# Patient Record
Sex: Male | Born: 1995 | Race: Black or African American | Hispanic: No | Marital: Single | Smoking: Current every day smoker
Health system: Southern US, Community
[De-identification: ages and names within clinical notes are randomized; demographics above are authoritative.]

## PROBLEM LIST (undated history)

## (undated) HISTORY — PX: HERNIA REPAIR: SHX51

---

## 2016-04-21 ENCOUNTER — Emergency Department (HOSPITAL_COMMUNITY): Payer: Medicaid Other

## 2016-04-21 ENCOUNTER — Encounter (HOSPITAL_COMMUNITY): Payer: Self-pay | Admitting: Emergency Medicine

## 2016-04-21 ENCOUNTER — Emergency Department (HOSPITAL_COMMUNITY)
Admission: EM | Admit: 2016-04-21 | Discharge: 2016-04-21 | Disposition: A | Discharge status: Home / Self Care | Payer: Medicaid Other | Attending: Emergency Medicine | Admitting: Emergency Medicine

## 2016-04-21 DIAGNOSIS — J029 Acute pharyngitis, unspecified: Secondary | ICD-10-CM | POA: Diagnosis present

## 2016-04-21 DIAGNOSIS — J069 Acute upper respiratory infection, unspecified: Secondary | ICD-10-CM

## 2016-04-21 DIAGNOSIS — F17210 Nicotine dependence, cigarettes, uncomplicated: Secondary | ICD-10-CM | POA: Insufficient documentation

## 2016-04-21 LAB — RAPID STREP SCREEN (MED CTR MEBANE ONLY): STREPTOCOCCUS, GROUP A SCREEN (DIRECT): NEGATIVE

## 2016-04-21 MED ORDER — IBUPROFEN 200 MG PO TABS
400.0000 mg | ORAL_TABLET | Freq: Once | ORAL | Status: AC
  Administered 2016-04-21: 400 mg via ORAL
  Filled 2016-04-21: qty 2

## 2016-04-21 NOTE — ED Provider Notes (Signed)
CSN: [id number]     Arrival date & time 04/21/16  1615 History  By signing my name below, I, Marisue HumbleMichelle Chaffee, attest that this documentation has been prepared under the direction and in the presence of non-physician practitioner, Wynetta EmeryNicole Roshaun Pound, PA-C. Electronically Signed: Marisue HumbleMichelle Chaffee, Scribe. 04/21/2016. 5:20 PM.   Chief Complaint  Patient presents with  . Sore Throat    sore throat x 1 day    The history is provided by the patient. No language interpreter was used.   HPI Comments:  Willie Morris is a 20 y.o. male who presents to the Emergency Department complaining of sore throat onset last night. Pt reports associated cough productive of yellow mucous, mild low back pain onset this morning, subjective fever, chest pain this morning and a "red spot on my chest" this morning. He has used cough drops with mild relief; no other treatments attempted PTA. Denies ear pain or rhinorrhea.   History reviewed. No pertinent past medical history. Past Surgical History  Procedure Laterality Date  . Hernia repair     Family History  Problem Relation Age of Onset  . Diabetes Mother    Social History  Substance Use Topics  . Smoking status: Current Every Day Smoker    Types: Cigarettes  . Smokeless tobacco: None  . Alcohol Use: No    Review of Systems  10 systems reviewed and all are negative for acute change except as noted in the HPI.   Allergies  Penicillins  Home Medications   Prior to Admission medications   Not on File   BP 127/51 mmHg  Pulse 106  Temp(Src) 99.6 F (37.6 C) (Oral)  Resp 18  Wt 150 lb (68.04 kg)  SpO2 95% Physical Exam  Constitutional: He appears well-developed and well-nourished.  HENT:  Head: Normocephalic.  Right Ear: External ear normal.  Left Ear: External ear normal.  Mouth/Throat: Oropharynx is clear and moist. No oropharyngeal exudate.  No drooling or stridor. Posterior pharynx mildly erythematous no significant tonsillar  hypertrophy. No exudate. Soft palate rises symmetrically. No TTP or induration under tongue.   No tenderness to palpation of frontal or bilateral maxillary sinuses.  Mild mucosal edema in the nares with scant rhinorrhea.  Bilateral tympanic membranes with normal architecture and good light reflex.    Eyes: Conjunctivae and EOM are normal. Pupils are equal, round, and reactive to light.  Neck: Normal range of motion. Neck supple.  Cardiovascular: Normal rate and regular rhythm.   Pulmonary/Chest: Effort normal and breath sounds normal. No stridor. No respiratory distress. He has no wheezes. He has no rales. He exhibits no tenderness.  Abdominal: Soft. There is no tenderness. There is no rebound and no guarding.  Nursing note and vitals reviewed.   ED Course  Procedures  DIAGNOSTIC STUDIES:  Oxygen Saturation is 95% on RA, adequate by my interpretation.    COORDINATION OF CARE:  5:18 PM Will order chest x-ray. Recommended pt take Ibuprofen. Recommended rest and fluids. Discussed treatment plan with pt at bedside and pt agreed to plan.  Labs Review Labs Reviewed  RAPID STREP SCREEN (NOT AT [facility])    Imaging Review No results found. I have personally reviewed and evaluated these images and lab results as part of my medical decision-making.   EKG Interpretation None      MDM   Final diagnoses:  Viral URI    Filed Vitals:   04/21/16 1659 04/21/16 1752  BP: 127/51 111/63  Pulse: 106 114  Temp: 99.6 F (  37.6 C) 99.4 F (37.4 C)  TempSrc: Oral Oral  Resp: 18 16  Weight: 68.04 kg   SpO2: 95% 96%    Medications  ibuprofen (ADVIL,MOTRIN) tablet 400 mg (400 mg Oral Given 04/21/16 1724)    Robt Brossart is 20 y.o. male presenting with cough and sore throat. NAD, Non-toxic appearing, AFVSS, LSCTA. Likely viral illness, advised patient to push fluids, over-the-counter medications for symptom relief.   Evaluation does not show pathology that would require  ongoing emergent intervention or inpatient treatment. Pt is hemodynamically stable and mentating appropriately. Discussed findings and plan with patient/guardian, who agrees with care plan. All questions answered. Return precautions discussed and outpatient follow up given.   I personally performed the services described in this documentation, which was scribed in my presence.  The recorded information has been reviewed and is accurate.    Wynetta Emery, PA-C 04/21/16 1845  Loren Racer, MD 04/21/16 2215

## 2016-04-21 NOTE — ED Notes (Signed)
Pt reports productive cough and sore throat x 24 hours. Did not attempt to medicate with OTC meds

## 2016-04-21 NOTE — Discharge Instructions (Signed)
Do not hesitate to return to the emergency room for any new, worsening or concerning symptoms.  Please obtain primary care using resource guide below. Let them know that you were seen in the emergency room and that they will need to obtain records for further outpatient management.     Upper Respiratory Infection, Adult Most upper respiratory infections (URIs) are a viral infection of the air passages leading to the lungs. A URI affects the nose, throat, and upper air passages. The most common type of URI is nasopharyngitis and is typically referred to as "the common cold." URIs run their course and usually go away on their own. Most of the time, a URI does not require medical attention, but sometimes a bacterial infection in the upper airways can follow a viral infection. This is called a secondary infection. Sinus and middle ear infections are common types of secondary upper respiratory infections. Bacterial pneumonia can also complicate a URI. A URI can worsen asthma and chronic obstructive pulmonary disease (COPD). Sometimes, these complications can require emergency medical care and may be life threatening.  CAUSES Almost all URIs are caused by viruses. A virus is a type of germ and can spread from one person to another.  RISKS FACTORS You may be at risk for a URI if:   You smoke.   You have chronic heart or lung disease.  You have a weakened defense (immune) system.   You are very young or very old.   You have nasal allergies or asthma.  You work in crowded or poorly ventilated areas.  You work in health care facilities or schools. SIGNS AND SYMPTOMS  Symptoms typically develop 2-3 days after you come in contact with a cold virus. Most viral URIs last 7-10 days. However, viral URIs from the influenza virus (flu virus) can last 14-18 days and are typically more severe. Symptoms may include:   Runny or stuffy (congested) nose.   Sneezing.   Cough.   Sore throat.    Headache.   Fatigue.   Fever.   Loss of appetite.   Pain in your forehead, behind your eyes, and over your cheekbones (sinus pain).  Muscle aches.  DIAGNOSIS  Your health care provider may diagnose a URI by:  Physical exam.  Tests to check that your symptoms are not due to another condition such as:  Strep throat.  Sinusitis.  Pneumonia.  Asthma. TREATMENT  A URI goes away on its own with time. It cannot be cured with medicines, but medicines may be prescribed or recommended to relieve symptoms. Medicines may help:  Reduce your fever.  Reduce your cough.  Relieve nasal congestion. HOME CARE INSTRUCTIONS   Take medicines only as directed by your health care provider.   Gargle warm saltwater or take cough drops to comfort your throat as directed by your health care provider.  Use a warm mist humidifier or inhale steam from a shower to increase air moisture. This may make it easier to breathe.  Drink enough fluid to keep your urine clear or pale yellow.   Eat soups and other clear broths and maintain good nutrition.   Rest as needed.   Return to work when your temperature has returned to normal or as your health care provider advises. You may need to stay home longer to avoid infecting others. You can also use a face mask and careful hand washing to prevent spread of the virus.  Increase the usage of your inhaler if you have asthma.  Do not use any tobacco products, including cigarettes, chewing tobacco, or electronic cigarettes. If you need help quitting, ask your health care provider. PREVENTION  The best way to protect yourself from getting a cold is to practice good hygiene.   Avoid oral or hand contact with people with cold symptoms.   Wash your hands often if contact occurs.  There is no clear evidence that vitamin C, vitamin E, echinacea, or exercise reduces the chance of developing a cold. However, it is always recommended to get plenty  of rest, exercise, and practice good nutrition.  SEEK MEDICAL CARE IF:   You are getting worse rather than better.   Your symptoms are not controlled by medicine.   You have chills.  You have worsening shortness of breath.  You have brown or red mucus.  You have yellow or brown nasal discharge.  You have pain in your face, especially when you bend forward.  You have a fever.  You have swollen neck glands.  You have pain while swallowing.  You have white areas in the back of your throat. SEEK IMMEDIATE MEDICAL CARE IF:   You have severe or persistent:  Headache.  Ear pain.  Sinus pain.  Chest pain.  You have chronic lung disease and any of the following:  Wheezing.  Prolonged cough.  Coughing up blood.  A change in your usual mucus.  You have a stiff neck.  You have changes in your:  Vision.  Hearing.  Thinking.  Mood. MAKE SURE YOU:   Understand these instructions.  Will watch your condition.  Will get help right away if you are not doing well or get worse.   This information is not intended to replace advice given to you by your health care provider. Make sure you discuss any questions you have with your health care provider.   Document Released: 05/12/2001 Document Revised: 04/02/2015 Document Reviewed: 02/21/2014 Elsevier Interactive Patient Education 2016 [employer]. [employer] Assistance The United Ways 211 is a great source of information about community services available.  Access by dialing 2-1-1 from anywhere in [place], or by website -  PooledIncome.pl.   Other Local Resources (Updated 12/2015)  Financial Assistance   Services    Phone Number and Address  [facility]  Low-cost medical care - 1st and 3rd Saturday of every month  Must not qualify for public or private insurance and must have limited income [phone] [address] [place], [place]    [employer] [facility] Social Services  Child care  Emergency assistance for housing and [employer]  Medicaid [phone] [address] [place], [place] [address]   [facility] Department  Low-cost medical care for children, communicable diseases, sexually-transmitted diseases, immunizations, maternity care, womens health and family planning [phone] [address] [place], [place] [address]  [facility]   Medication assistance for [facility] residents  Must meet income requirements [phone] [address] [place], [place].    [facility] Social Services  Child care  Emergency assistance for housing and [employer]  Medicaid [phone] [address] [place], [place] [address]  [facility]   Low-cost medical care,   Monday through Friday, 9 am to 6 pm.   Accepts Medicare/Medicaid, and self-pay [phone] [address] [place], [place] [address]  [facility]  Low-cost medical care - Monday through Friday, 8:30 am -  5:30 pm  Accepts Medicaid and self-pay [phone] [address] [place], [place] [address]   [facility]  Primary medical care, including for those with sickle cell disease  Accepts Medicare, Medicaid, insurance and self-pay [phone] [address] [place], [place]  [facility]   Primary medical care  Accepts Medicare, [place], insurance and self-pay [phone] [address], [address] [place], [place] [address]   [facility] Social Services  Child care  Emergency assistance for housing and [employer]ark[employer]ilities  Food stamps  Medicaid [id number] [address] [place], [place] [address]  [facility] of Health and [employer] Services  Child care  Emergency assistance for housing and  [employer]ilities  Food stamps  Medicaid [phone] [address] [place], [place] [address]   [facility]  Medication assistance for [facility] [place] [place] residents with no insurance only  Must have a primary care doctor [phone] [address], [address] [place], [place]  [facility]   Primary medical care  [place]s Medicare, [place], insurance  [phone] [address] [place], [place]  [facility]   Medication assistance [phone]  [facility]   Primary medical care  Accepts Medicare, [place], insurance and self-pay [phone] [address] [place], [place] [address]  [facility]   Primary medical care  Accepts Medicare, [place], insurance and self-pay [phone] [address] [place], [place] [address]  [facility]  For [place] residents between the ages of 5118 and 1364 who do not have any form of health insurance, Medicare, [place], or [place] benefits.  Services are provided free of charge to uninsured patients who fall within federal poverty guidelines.    Hours: Tuesdays and Thursdays, 4:15 - 8 pm [phone] [address], [address] [place], [place] [address]  [facility]     Primary medical care  Dental care  Nutritional counseling  Pharmacy  Accepts Medicaid, Medicare, most insurance.  Fees are adjusted based on ability to pay.   [phone] [facility] [address] [place], [place]  [phone] [facility] [address] [place], [place]  [phone] [facility] [place], [place]  [phone] [facility], [address] [place], [place]  [phone] [facility] [address] [place], [place]  [facility]  Womens health and family planning [phone] [place], [place]  [facility] of Social Services  Child care  Emergency assistance for housing and Kimberly[employer]Clarkutilities  Food stamps  Medicaid [phone] [address], [place], [place] [address]   [facility]    Ages 1018 and older  Hours: Mondays and Thursdays, 7:00 am - 9:00 am Patients are seen on a first come, first served basis. [phone], [phone]. [phone] [place], [place]  [facility] of Social Services  Child care  Emergency assistance for housing and Kimberly-Clarkutilities  Food stamps  Medicaid [phone] [address] [place], [place] [address]  [facility] [employer] [facility]  Medication assistance  Rental assistance  Food pantry  Medication assistance  Housing assistance  Emergency food distribution  Utility assistance [phone] [address] [place], [place]  [phone]  [address] [place], [place] [address] Hours: Tuesdays and Thursdays from 9am - 12 noon by appointment only  [phone] [address] [place], [place] [address]  [facility] -  Lanae Boast   Accepts private insurance, Medicare, and Medicaid.  Payment is based on a sliding scale for those without insurance.  Hours: Mondays, Tuesdays and Thursdays, 8:30 am - 5:30 pm.   [phone] [address] [place], [place]  [facility] - Family Medicine at [facility], [place], and [place].  Payment is based on a sliding scale for those without insurance. [phone] [address] [place], [place]  [facility] - Pediatrics at E. Scientist, research (physical sciences), [employer], and [place].  Payment is based on a sliding scale for those without insurance [phone] [address], [employer], [place]  [facility] - Pediatrics at [employer], [place], and [place].  Payment is based on  a sliding scale for those without insurance. [phone] [address] [place], [place]  [facility] - Pediatrics at [facility], [place], and [place].  Payment is based on a sliding scale for those without insurance. [phone], ext. [phone] [place], [place].    [facility]  Maternity care.  Accepts Medicaid and self-pay. [phone] [address] [place], [place]

## 2016-04-24 LAB — CULTURE, GROUP A STREP (THRC)

## 2016-10-24 IMAGING — CR DG CHEST 2V
2 series · 2 of 2 positions shown · non-contrast
Comparison: None

CLINICAL DATA: Productive cough beginning last night with some
shortness of breath and LEFT side chest pain, history smoking

EXAM:
CHEST  2 VIEW

[w chest pa]
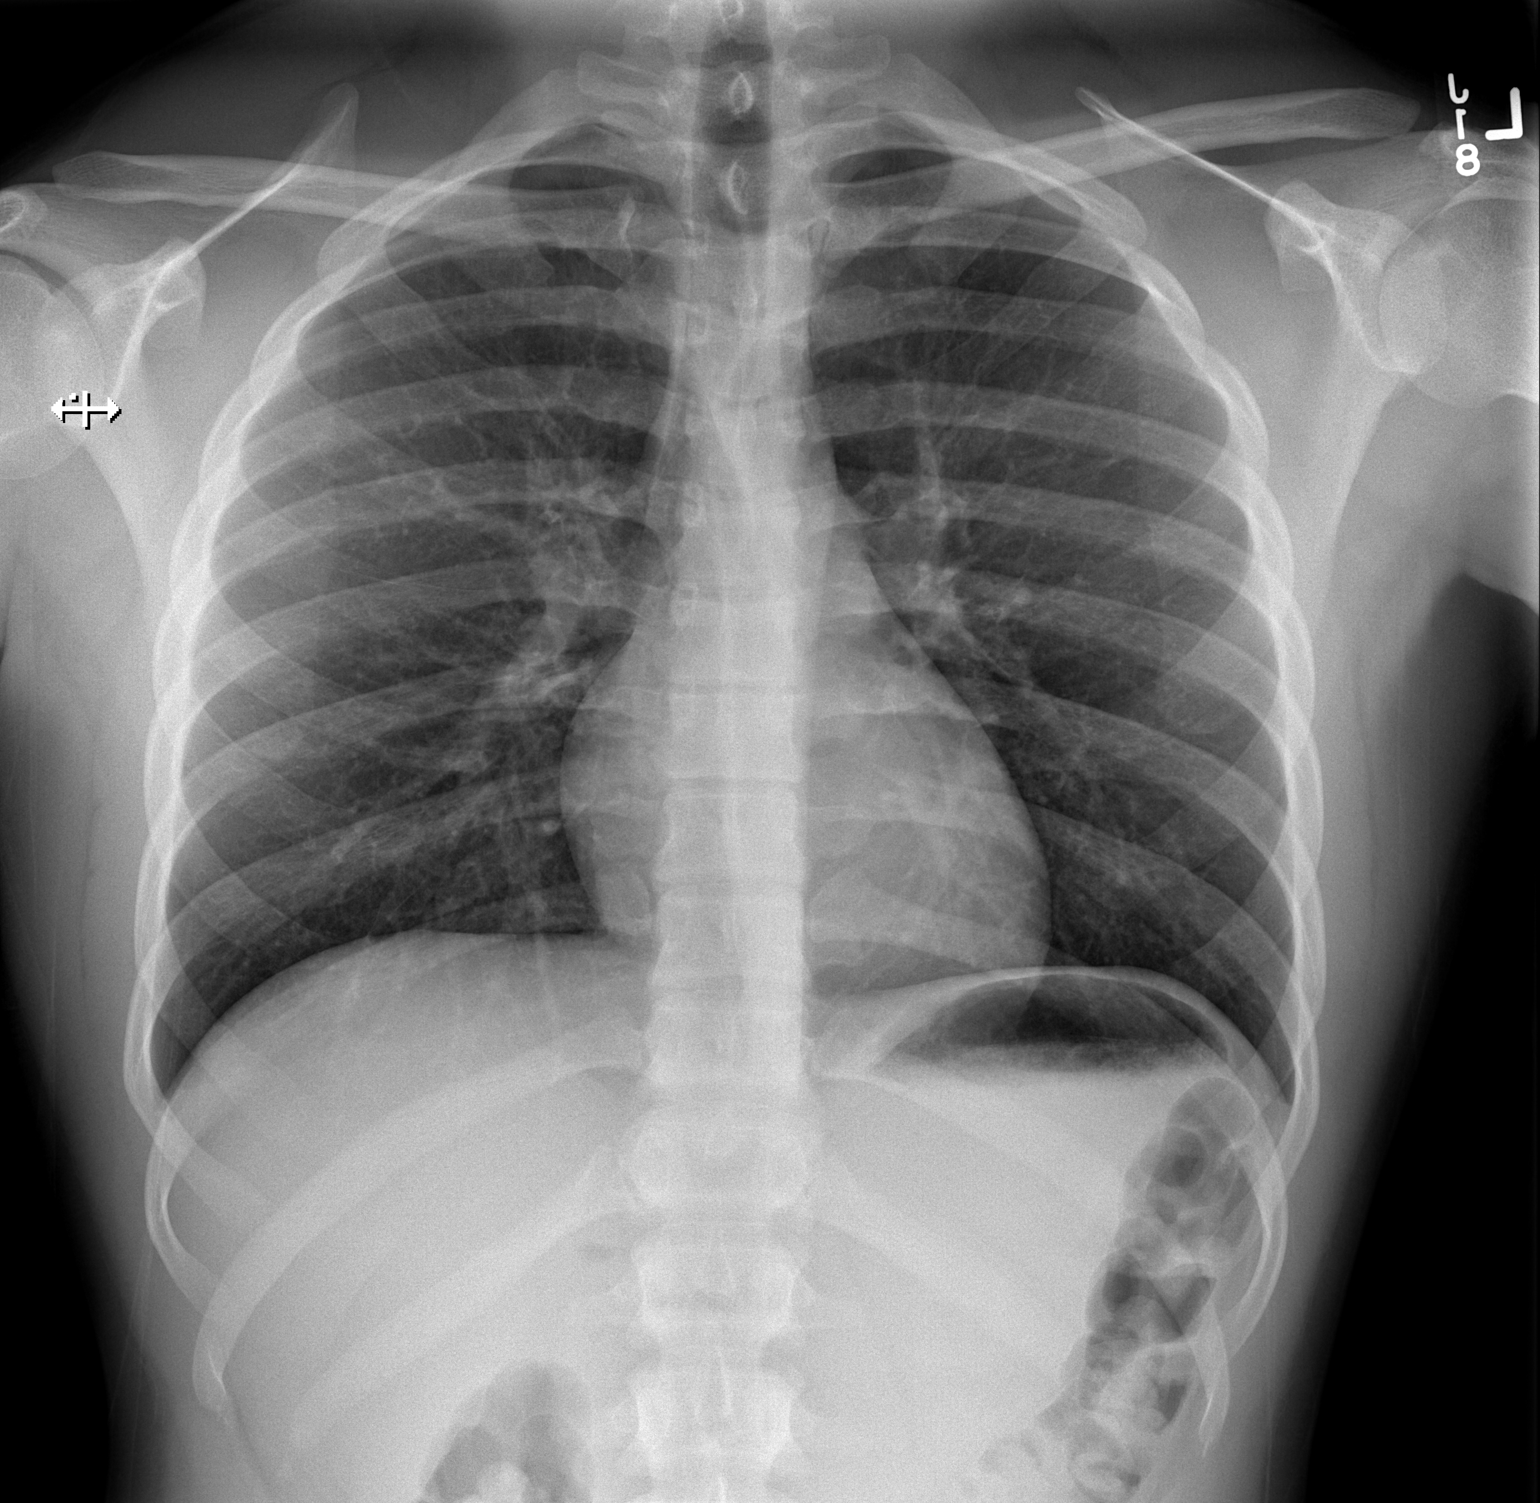

[w chest lat]
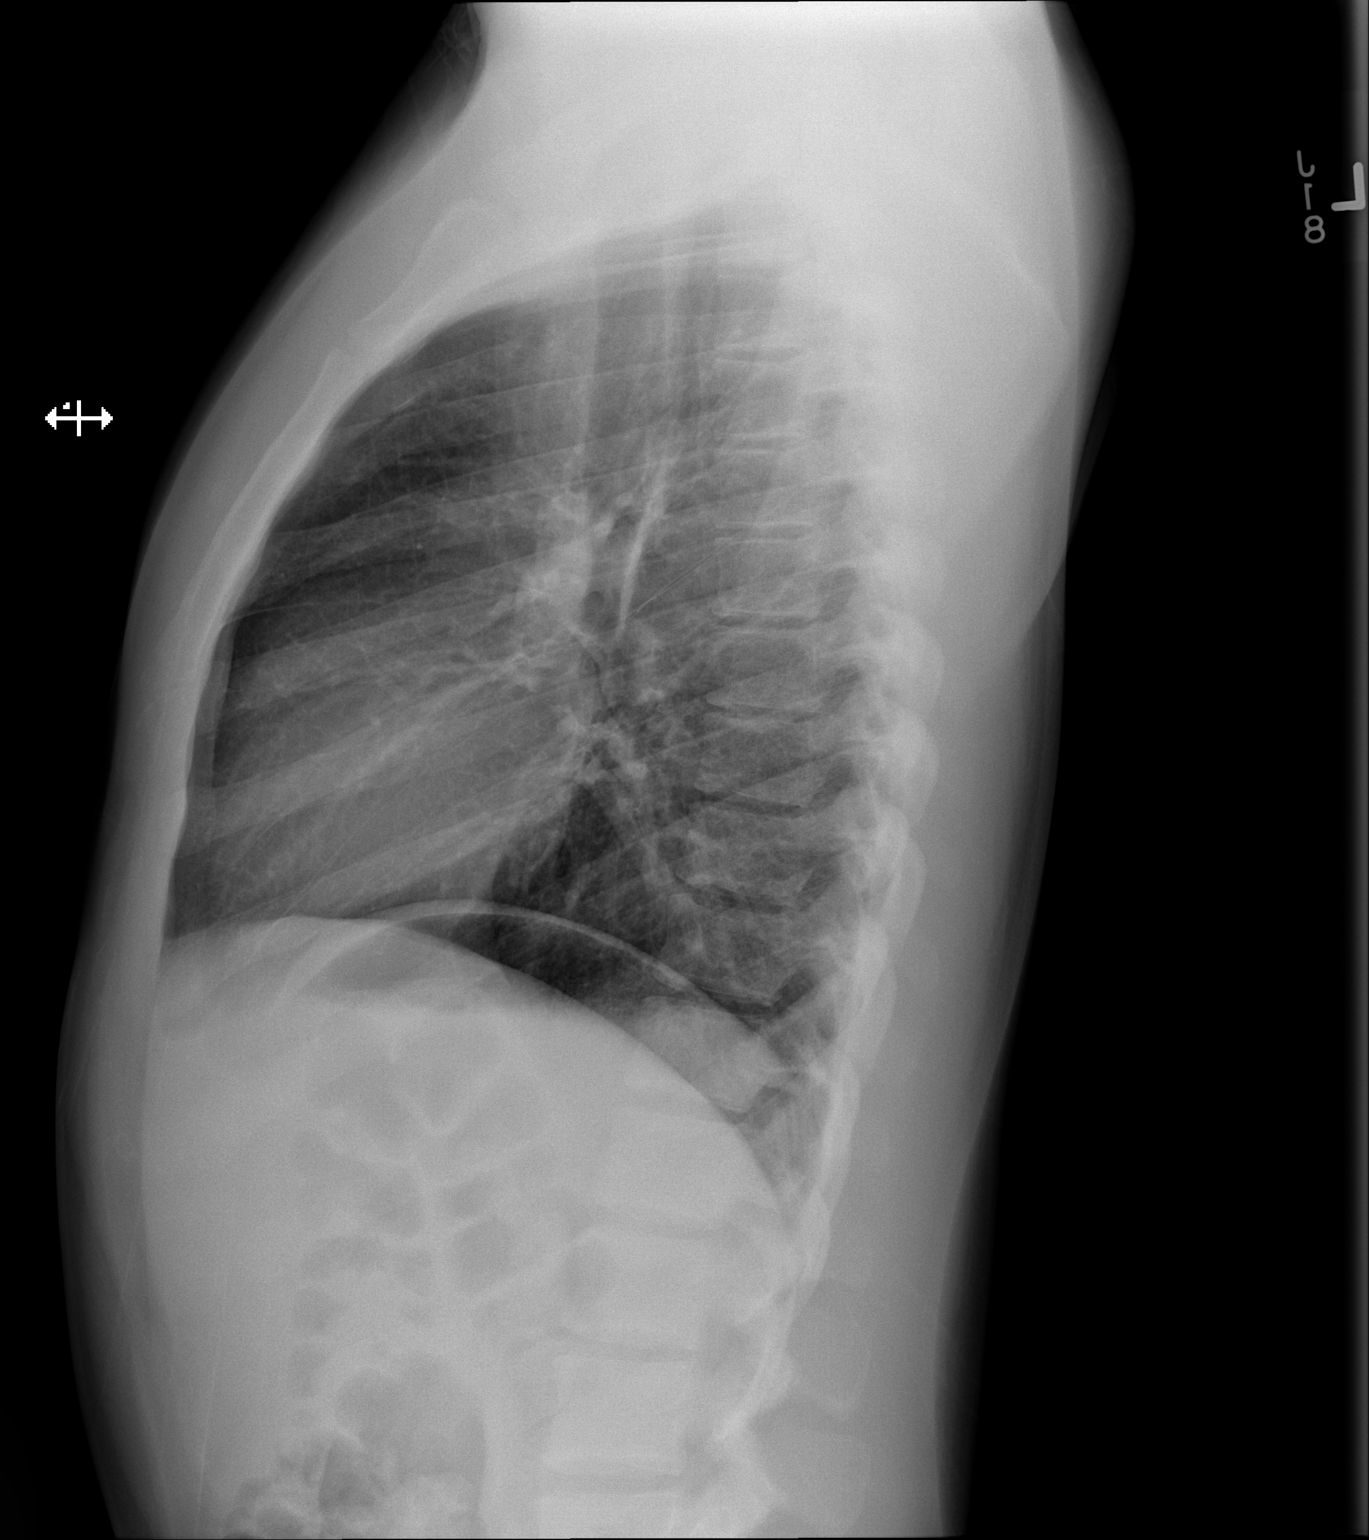

[2 of 2 positions shown; findings below may reference images not displayed]

FINDINGS: Normal heart size, mediastinal contours, and pulmonary vascularity.

Lungs clear.

No pleural effusion or pneumothorax.

Bones unremarkable.
IMPRESSION: Normal exam.

## 2024-04-07 ENCOUNTER — Ambulatory Visit (HOSPITAL_COMMUNITY)
Admission: EM | Admit: 2024-04-07 | Discharge: 2024-04-08 | Disposition: A | Discharge status: Home / Self Care | Attending: Psychiatry | Admitting: Psychiatry

## 2024-04-07 ENCOUNTER — Encounter (HOSPITAL_COMMUNITY): Payer: Self-pay | Admitting: Emergency Medicine

## 2024-04-07 DIAGNOSIS — R45851 Suicidal ideations: Secondary | ICD-10-CM | POA: Diagnosis not present

## 2024-04-07 DIAGNOSIS — F1420 Cocaine dependence, uncomplicated: Secondary | ICD-10-CM | POA: Insufficient documentation

## 2024-04-07 DIAGNOSIS — F1490 Cocaine use, unspecified, uncomplicated: Secondary | ICD-10-CM

## 2024-04-07 LAB — COMPREHENSIVE METABOLIC PANEL WITH GFR
ALT: 29 U/L (ref 0–44)
AST: 29 U/L (ref 15–41)
Albumin: 5.1 g/dL — ABNORMAL HIGH (ref 3.5–5.0)
Alkaline Phosphatase: 58 U/L (ref 38–126)
Anion gap: 10 (ref 5–15)
BUN: 11 mg/dL (ref 6–20)
CO2: 28 mmol/L (ref 22–32)
Calcium: 10.8 mg/dL — ABNORMAL HIGH (ref 8.9–10.3)
Chloride: 103 mmol/L (ref 98–111)
Creatinine, Ser: 1.19 mg/dL (ref 0.61–1.24)
GFR, Estimated: >60 mL/min (ref >60)
Glucose, Bld: 77 mg/dL (ref 70–99)
Potassium: 4.6 mmol/L (ref 3.5–5.1)
Sodium: 141 mmol/L (ref 135–145)
Total Bilirubin: 0.6 mg/dL (ref 0.0–1.2)
Total Protein: 8.1 g/dL (ref 6.5–8.1)

## 2024-04-07 LAB — CBC WITH DIFFERENTIAL/PLATELET
Abs Immature Granulocytes: 0.02 10*3/uL (ref 0.00–0.07)
Basophils Absolute: 0 10*3/uL (ref 0.0–0.1)
Basophils Relative: 1 %
Eosinophils Absolute: 0 10*3/uL (ref 0.0–0.5)
Eosinophils Relative: 0 %
HCT: 43.5 % (ref 39.0–52.0)
Hemoglobin: 14.6 g/dL (ref 13.0–17.0)
Immature Granulocytes: 0 %
Lymphocytes Relative: 33 %
Lymphs Abs: 2.9 10*3/uL (ref 0.7–4.0)
MCH: 33.7 pg (ref 26.0–34.0)
MCHC: 33.6 g/dL (ref 30.0–36.0)
MCV: 100.5 fL — ABNORMAL HIGH (ref 80.0–100.0)
Monocytes Absolute: 0.6 10*3/uL (ref 0.1–1.0)
Monocytes Relative: 7 %
Neutro Abs: 5.1 10*3/uL (ref 1.7–7.7)
Neutrophils Relative %: 59 %
Platelets: 256 10*3/uL (ref 150–400)
RBC: 4.33 MIL/uL (ref 4.22–5.81)
RDW: 11.7 % (ref 11.5–15.5)
WBC: 8.7 10*3/uL (ref 4.0–10.5)
nRBC: 0 % (ref 0.0–0.2)

## 2024-04-07 LAB — POCT URINE DRUG SCREEN - MANUAL ENTRY (I-SCREEN)
POC Amphetamine UR: NOT DETECTED
POC Buprenorphine (BUP): NOT DETECTED
POC Cocaine UR: NOT DETECTED
POC Marijuana UR: NOT DETECTED
POC Methadone UR: NOT DETECTED
POC Methamphetamine UR: NOT DETECTED
POC Morphine: NOT DETECTED
POC Oxazepam (BZO): NOT DETECTED
POC Oxycodone UR: NOT DETECTED
POC Secobarbital (BAR): NOT DETECTED

## 2024-04-07 LAB — LIPID PANEL
Cholesterol: 252 mg/dL — ABNORMAL HIGH (ref 0–200)
HDL: 63 mg/dL (ref >40)
LDL Cholesterol: 167 mg/dL — ABNORMAL HIGH (ref 0–99)
Total CHOL/HDL Ratio: 4 ratio
Triglycerides: 108 mg/dL (ref <150)
VLDL: 22 mg/dL (ref 0–40)

## 2024-04-07 LAB — ETHANOL: Alcohol, Ethyl (B): <15 mg/dL (ref <15)

## 2024-04-07 LAB — TSH: TSH: 2.58 u[IU]/mL (ref 0.350–4.500)

## 2024-04-07 LAB — HEMOGLOBIN A1C
Hgb A1c MFr Bld: 4.1 % — ABNORMAL LOW (ref 4.8–5.6)
Mean Plasma Glucose: 70.97 mg/dL

## 2024-04-07 MED ORDER — DIPHENHYDRAMINE HCL 50 MG/ML IJ SOLN: 50.0000 mg | Freq: Three times a day (TID) | INTRAMUSCULAR | Status: DC | PRN

## 2024-04-07 MED ORDER — HALOPERIDOL 5 MG PO TABS: 5.0000 mg | ORAL_TABLET | Freq: Three times a day (TID) | ORAL | Status: DC | PRN

## 2024-04-07 MED ORDER — ACETAMINOPHEN 325 MG PO TABS: 650.0000 mg | ORAL_TABLET | Freq: Four times a day (QID) | ORAL | Status: DC | PRN

## 2024-04-07 MED ORDER — HYDROXYZINE HCL 25 MG PO TABS
25.0000 mg | ORAL_TABLET | Freq: Three times a day (TID) | ORAL | Status: DC | PRN
  Administered 2024-04-07: 25 mg via ORAL
  Filled 2024-04-07: qty 1

## 2024-04-07 MED ORDER — HALOPERIDOL LACTATE 5 MG/ML IJ SOLN: 5.0000 mg | Freq: Three times a day (TID) | INTRAMUSCULAR | Status: DC | PRN

## 2024-04-07 MED ORDER — LORAZEPAM 2 MG/ML IJ SOLN: 2.0000 mg | Freq: Three times a day (TID) | INTRAMUSCULAR | Status: DC | PRN

## 2024-04-07 MED ORDER — ALUM & MAG HYDROXIDE-SIMETH 200-200-20 MG/5ML PO SUSP: 30.0000 mL | ORAL | Status: DC | PRN

## 2024-04-07 MED ORDER — DIPHENHYDRAMINE HCL 50 MG PO CAPS: 50.0000 mg | ORAL_CAPSULE | Freq: Three times a day (TID) | ORAL | Status: DC | PRN

## 2024-04-07 MED ORDER — TRAZODONE HCL 50 MG PO TABS
50.0000 mg | ORAL_TABLET | Freq: Every evening | ORAL | Status: DC | PRN
  Administered 2024-04-07: 50 mg via ORAL
  Filled 2024-04-07: qty 1

## 2024-04-07 MED ORDER — HALOPERIDOL LACTATE 5 MG/ML IJ SOLN: 10.0000 mg | Freq: Three times a day (TID) | INTRAMUSCULAR | Status: DC | PRN

## 2024-04-07 MED ORDER — MAGNESIUM HYDROXIDE 400 MG/5ML PO SUSP: 30.0000 mL | Freq: Every day | ORAL | Status: DC | PRN

## 2024-04-07 NOTE — ED Notes (Signed)
 Pt sleeping at present, no distress noted, monitoring for safety.

## 2024-04-07 NOTE — Progress Notes (Signed)
   04/07/24 1258  [facility] Triage Screening (Walk-ins at [facility] only)  How Did You Hear About Us ? Legal System  What Is the Reason for Your Visit/Call Today? Willie Morris is a 28 year old male presenting to [name] escorted by [name]. Pt reports that he was recently discharged from a treatment center from cocaine use. Pt reports that he was going to [name] but has stopped taking his medication. Pt endorses SI at this time, but mentions he would not end his life. Pt reports he has suicidal thoughts daily. Pt is looking for inpatient treatment and resources for his ongoing cocaine addiction and SI thoughts. Pt denies substance use, HI and Avh.  How Long Has This Been Causing You Problems? <Week  Have You Recently Had Any Thoughts About Hurting Yourself? Yes  How long ago did you have thoughts about hurting yourself? today  Are You Planning to Commit Suicide/Harm Yourself At This time? No  Have you Recently Had Thoughts About Hurting Someone [name]? No  Are You Planning To Harm Someone At This Time? No  Physical Abuse Denies  Verbal Abuse Denies  Sexual Abuse Denies  Exploitation of patient/patient's resources Denies  Self-Neglect Denies  Possible abuse reported to: Other (Comment)  Are you currently experiencing any auditory, visual or other hallucinations? No  Have You Used Any Alcohol or Drugs in the Past 24 Hours? No  Do you have any current medical co-morbidities that require immediate attention? No  Clinician description of patient physical appearance/behavior: calm, cooperative, hyper  What Do You Feel Would Help You the Most Today? Medication(s)  If access to [facility] was not available, would you have sought care in the Emergency Department? No  Determination of Need Urgent (48 hours)  Options For Referral Inpatient Hospitalization;Medication Management  Determination of Need filed? Yes

## 2024-04-07 NOTE — BH Assessment (Signed)
 Comprehensive Clinical Assessment (CCA) Note  04/07/2024 Willie Morris [id number] DISPOSITION: Patient has been recommended to continuous assessment as he awaits an inpatient admission to assist with ongoing needs.   The patient demonstrates the following risk factors for suicide: Chronic risk factors for suicide include: substance use disorder. Acute risk factors for suicide include: unemployment. Protective factors for this patient include: coping skills. Considering these factors, the overall suicide risk at this point appears to be low. Patient is appropriate for outpatient follow up.   Patient is a 28 year old male that presents this date as a voluntary walk in to [facility] accompanied by [name] with complaints of suicidal ideations with thoughts to overdose on cocaine. Patient has a PMHx significant for mild IDD, ODD, ADHD, MDD and cocaine use. Patient reports that he was receiving outpatient psychiatric treatment at [facility] in [facility] prior to being arrested for cocaine possession on March 16, 2024 who assisted with medication management for symptom management.   Although patient states since that arrest his life, "has just went downhill," stating that after he was arrested it put his relationship in jeopardy and, "cost him a job," that he was applying for. Patient states he is currently living, "here and there," since then and most recently was discharged from [facility] after he was there for 5 days and had to leave after a relative brought him his medications (which he said was trazodone) which he needed for sleep and after informing them was discharged since they do not allow medications while in their program.   Patient reports that he has been having worsening depressive symptoms over the past month to include: sadness, hopelessness, worthlessness, anhedonia and suicidal ideations for the past couple weeks.  Patient states that he was planning on buying a bunch of cocaine today to  overdose on. Patient states that he has been maintaining his sobriety for over one month although up until that point patient reported using anywhere form 1 to 3 grams of cocaine a week, patient states he would use 3 to 5 times a week and denies any withdrawals at the time of assessment. Patient denies access to firearms.     During evaluation Willie Morris is sitting in assessment room alert & oriented x 4, calm, cooperative and attentive for this assessment. His mood is depressed with incongruent euthymic affect.  He has normal speech, and restless behavior.  Objectively there is no evidence of psychosis/mania or delusional thinking. Pt does not appear to be responding to internal or external stimuli.  Patient is able to converse coherently, goal directed thoughts, no distractibility, or pre-occupation.  He currently denies homicidal ideation, psychosis, and paranoia.  Patient continues to endorse having suicidal ideations with a plan to overdose on cocaine.  Patient answered question appropriately.        Chief Complaint:  Chief Complaint  Patient presents with   Suicidal Thoughts    Visit Diagnosis: MDD recurrent without psychotic features, severe     CCA Screening, Triage and Referral (STR)  Patient Reported Information How did you hear about us ? Self  What Is the Reason for Your Visit/Call Today? Acy is a 28 year old male presenting to [facility] escorted by [name]. Pt reports that he was recently discharged from a treatment center from cocaine use. Pt reports that he was going to [facility] for medication but has stopped taking his medication. Pt endorses SI at this time, but mentions he would not end his life. Pt reports he has suicidal  thoughts daily. Pt is looking for inpatient treatment and resources for his ongoing cocaine addiction and SI thoughts. Pt denies substance use, HI and Avh.  How Long Has This Been Causing You Problems? 1-6 months  What Do You Feel Would Help You the  Most Today? Alcohol or Drug Use Treatment   Have You Recently Had Any Thoughts About Hurting Yourself? Yes  Are You Planning to Commit Suicide/Harm Yourself At This time? No   Flowsheet Row ED from 04/07/2024 in [facility]  C-SSRS RISK CATEGORY No Risk       Have you Recently Had Thoughts About Hurting Someone [name]? No  Are You Planning to Harm Someone at This Time? No  Explanation: NA   Have You Used Any Alcohol or Drugs in the Past 24 Hours? No  How Long Ago Did You Use Drugs or Alcohol? Pt states over a month ago  What Did You Use and How Much? NA  Do You Currently Have a Therapist/Psychiatrist? No  Name of Therapist/Psychiatrist:  NA  Have You Been Recently Discharged From Any Office Practice or Programs? No  Explanation of Discharge From Practice/Program: NA    CCA Screening Triage Referral Assessment Type of Contact: Face-to-Face  Telemedicine Service Delivery:  04/07/2024 Is this Initial or Reassessment? Initial   Date Telepsych consult ordered in [facility]:   04/07/2024 Time Telepsych consult ordered in [facility]:   1530 Location of Assessment: [facility] Assessment Services  Provider Location: [name] Assessment Services   Collateral Involvement: None noted   Does Patient Have a Automotive engineer Guardian? No  Legal Guardian Contact Information: NA  Copy of Legal Guardianship Form: -- (NA)  Legal Guardian Notified of Arrival: -- (NA)  Legal Guardian Notified of Pending Discharge: -- (NA)  If Minor and Not Living with Parent(s), Who has Custody? NA  Is CPS involved or ever been involved? Never  Is APS involved or ever been involved? Never   Patient Determined To Be At Risk for Harm To Self or Others Based on Review of Patient Reported Information or Presenting Complaint? Yes, for Self-Harm  Method: Plan without intent  Availability of Means: No access or NA  Intent: Vague intent or NA  Notification Required: No need or  identified person  Additional Information for Danger to Others Potential: -- (NA)  Additional Comments for Danger to Others Potential: NA  Are There Guns or Other Weapons in Your Home? No  Types of Guns/Weapons: NA  Are These Weapons Safely Secured?                            -- (NA)  Who Could Verify You Are Able To Have These Secured: NA  Do You Have any Outstanding Charges, Pending Court Dates, Parole/Probation? Drug charges coming up on 04/15/2024  Contacted To Inform of Risk of Harm To Self or Others: -- (NA)    Does Patient Present under Involuntary Commitment? No    [place] of Residence: [place]   Patient Currently Receiving the Following Services: Not Receiving Services   Determination of Need: Urgent (48 hours)   Options For Referral: Inpatient Hospitalization     CCA Biopsychosocial Patient Reported Schizophrenia/Schizoaffective Diagnosis in Past: No   Strengths: Patient is willing to participate in treatment and states he is intent on maintaining his recovery efforts   Mental Health Symptoms Depression:  Change in energy/activity; Hopelessness   Duration of Depressive symptoms: Duration of Depressive  Symptoms: Greater than two weeks   Mania:  None   Anxiety:   Difficulty concentrating   Psychosis:  None   Duration of Psychotic symptoms:    Trauma:  None   Obsessions:  None   Compulsions:  None   Inattention:  None   Hyperactivity/Impulsivity:  None   Oppositional/Defiant Behaviors:  None   Emotional Irregularity:  Chronic feelings of emptiness   Other Mood/Personality Symptoms:  None noted    Mental Status Exam Appearance and self-care  Stature:  Average   Weight:  Average weight   Clothing:  Casual   Grooming:  Normal   Cosmetic use:  None   Posture/gait:  Normal   Motor activity:  Not Remarkable   Sensorium  Attention:  Normal   Concentration:  Normal   Orientation:  X5   Recall/memory:  Normal   Affect and  Mood  Affect:  Anxious   Mood:  Anxious   Relating  Eye contact:  Normal   Facial expression:  Anxious   Attitude toward examiner:  Cooperative   Thought and Language  Speech flow: Clear and Coherent   Thought content:  Appropriate to Mood and Circumstances   Preoccupation:  None   Hallucinations:  None   Organization:  Intact; Goal-directed   Company secretary of Knowledge:  Fair   Intelligence:  Average   Abstraction:  Normal   Judgement:  Fair   Brewing technologist   Insight:  Fair   Decision Making:  Normal   Social Functioning  Social Maturity:  Responsible   Social Judgement:  Normal   Stress  Stressors:  Optometrist Ability:  Human resources officer Deficits:  Activities of daily living   Supports:  Support needed     Religion: Religion/Spirituality Are You A Religious Person?: No How Might This Affect Treatment?: NA  Leisure/Recreation: Leisure / Recreation Do You Have Hobbies?: No  Exercise/Diet: Exercise/Diet Do You Exercise?: No Have You Gained or Lost A Significant Amount of Weight in the Past Six Months?: No Do You Follow a Special Diet?: No Do You Have Any Trouble Sleeping?: No   CCA Employment/Education Employment/Work Situation: Employment / Work Situation Employment Situation: Unemployed Patient's Job has Been Impacted by Current Illness: No Has Patient ever Been in Equities trader?: No  Education: Education Is Patient Currently Attending School?: No Last Grade Completed: 12 Did You Product manager?: No Did You Have An Individualized Education Program (IIEP): No Did You Have Any Difficulty At [employer]?: No Patient's Education Has Been Impacted by Current Illness: No   CCA Family/Childhood History Family and Relationship History: Family history Marital status: Single Does patient have children?: No  Childhood History:  Childhood History By whom was/is the patient raised?: Both parents Did patient  suffer any verbal/emotional/physical/sexual abuse as a child?: No Did patient suffer from severe childhood neglect?: No Has patient ever been sexually abused/assaulted/raped as an adolescent or adult?: No Was the patient ever a victim of a crime or a disaster?: No Witnessed domestic violence?: No Has patient been affected by domestic violence as an adult?: No       CCA Substance Use Alcohol/Drug Use: Alcohol / Drug Use Pain Medications: See MAR Prescriptions: See MAR Over the Counter: See MAU History of alcohol / drug use?: Yes Longest period of sobriety (when/how long): Current 1 month Negative Consequences of Use: Legal Withdrawal Symptoms: None Substance #1 Name of Substance 1: Cocaine 1 - Age of First Use: 21  1 - Amount (size/oz): varies from 1 to 3 grams a week 1 - Frequency: 3 to 4 times a week 1 - Duration: Ongoing for last year 1 - Last Use / Amount: 03/16/2024 1 - Method of Aquiring: Illegal 1- Route of Use: Snorting                       ASAM's:  Six Dimensions of Multidimensional Assessment  Dimension 1:  Acute Intoxication and/or Withdrawal Potential:   Dimension 1:  Description of individual's past and current experiences of substance use and withdrawal: No signs of withdrawals  Dimension 2:  Biomedical Conditions and Complications:   Dimension 2:  Description of patient's biomedical conditions and  complications: Fully functioning  Dimension 3:  Emotional, Behavioral, or Cognitive Conditions and Complications:  Dimension 3:  Description of emotional, behavioral, or cognitive conditions and complications: Good impulse control  Dimension 4:  Readiness to Change:  Dimension 4:  Description of Readiness to Change criteria: Willing to engage in treatment  Dimension 5:  Relapse, Continued use, or Continued Problem Potential:  Dimension 5:  Relapse, continued use, or continued problem potential critiera description: Impaired understanding of the recovery model   Dimension 6:  Recovery/Living Environment:  Dimension 6:  Recovery/Iiving environment criteria description: Environment is not supportive due to patient not having housing  ASAM Severity Score: ASAM's Severity Rating Score: 4  ASAM Recommended Level of Treatment: ASAM Recommended Level of Treatment:  (Inpatient)   Substance use Disorder (SUD) Substance Use Disorder (SUD)  Checklist Symptoms of Substance Use: Continued use despite having a persistent/recurrent physical/psychological problem caused/exacerbated by use, Continued use despite persistent or recurrent social, interpersonal problems, caused or exacerbated by use  Recommendations for Services/Supports/Treatments: Recommendations for Services/Supports/Treatments Recommendations For Services/Supports/Treatments: Inpatient Hospitalization  Disposition Recommendation per psychiatric provider: We recommend inpatient psychiatric hospitalization when medically cleared. Patient is under voluntary admission status at this time; please IVC if attempts to leave hospital.   DSM5 Diagnoses: There are no active problems to display for this patient.    Referrals to Alternative Service(s): Referred to Alternative Service(s):   Place:   Date:   Time:    Referred to Alternative Service(s):   Place:   Date:   Time:    Referred to Alternative Service(s):   Place:   Date:   Time:    Referred to Alternative Service(s):   Place:   Date:   Time:     Ansel Bass, LCAS

## 2024-04-07 NOTE — ED Provider Notes (Signed)
 [facility] Continuous Assessment Admission H&P  Date: 04/07/24 Patient Name: Willie Morris MRN: [id number] Chief Complaint: "Suicidal thoughts for a while"  Diagnoses:  Final diagnoses:  Suicidal ideations  Cocaine use    HPI: Willie Morris 28 y.o., male patient presented to [facility] as a voluntary walk in accompanied by [name] with complaints of suicidal ideations with thoughts to overdose on cocaine. Mild IDD, ODD, ADHD, cocaine use and THC use.  Patient reports that he was receiving outpatient psychiatric treatment at [facility] prior to being arrested for cocaine possession on March 16, 2024.  Patient has extensive history with in the [facility] healthcare system. Eyal Borquez, is seen face to face by this provider, consulted with Dr. Docia Freeman; and chart reviewed on 04/07/24.  After extensive chart review, it is noted that patient has a history of aggressive behavior and agitation as well as substance abuse.  His last inpatient hospitalization was in 2015 in [place] (possibly at [facility]).  He also has inpatient hospitalizations at [facility] throughout his adolescence for aggression and defiance.  During assessment patient that he is is unsure what medication he is prescribed and states that he has not taken it in about 1 month.  He states that he is going to call his mom and cousin to the they could read his medications to him.  Patient states that he is currently on pretrial release and was staying at a substance abuse residential program called [facility], however he was kicked out because they do not allow psychotropic medications in the facility.  Patient reports that he has been having worsening depressive symptoms and cocaine to mask those symptoms including sadness, hopelessness, worthlessness, anhedonia and suicidal ideations for the past couple weeks.  Patient states that he was planning on buying a bunch of cocaine today to overdose on.  Patient reports that he has a  child on the way and that is why he came in to get help.  Patient reports that he also has a upcoming court date for his drug-related charge in June and that he would like to be better stabilized at that time.  Patient denies history of prior suicide attempts.  He does report having a history of aggressive behaviors as a teenager.  Patient reports poor sleep, difficulty staying asleep and falling asleep.  Patient reports having a good appetite.  No aggression or agitation noted during this assessment.  Patient states that he feels that he may need inpatient psychiatric treatment so that these thoughts do not get worse and so that he can eventually be back around his girlfriend and unborn child.  We discussed the process for admission to an inpatient psychiatric unit and patient agrees to this plan.  During evaluation Willie Morris is sitting in assessment room, in no acute distress.  He is alert & oriented x 4, calm, cooperative and attentive for this assessment.  His mood is depressed with incongruent euthymic affect.  He has normal speech, and restless behavior.  Objectively there is no evidence of psychosis/mania or delusional thinking. Pt does not appear to be responding to internal or external stimuli.  Patient is able to converse coherently, goal directed thoughts, no distractibility, or pre-occupation.  He currently denies homicidal ideation, psychosis, and paranoia.  Patient continues to endorse having suicidal ideations with a plan to overdose on cocaine.  Patient answered assessment questions appropriately.    Total Time spent with patient: 30 minutes  Musculoskeletal  Strength & Muscle Tone: within normal limits  Gait & Station: normal Patient leans: N/A  Psychiatric Specialty Exam  Presentation General Appearance:  Casual  Eye Contact: Fair  Speech: Clear and Coherent  Speech Volume: Normal  Handedness:No data recorded  Mood and Affect   Mood: Depressed  Affect: Non-Congruent   Thought Process  Thought Processes: Coherent  Descriptions of Associations:Intact  Orientation:Full (Time, Place and Person)  Thought Content:WDL    Hallucinations:Hallucinations: None  Ideas of Reference:None  Suicidal Thoughts:Suicidal Thoughts: Yes, Passive SI Passive Intent and/or Plan: With Plan  Homicidal Thoughts:Homicidal Thoughts: No   Sensorium  Memory: Recent Fair; Immediate Good  Judgment: Fair  Insight: Fair   Chartered certified accountant: Fair  Attention Span: Fair  Recall: [employer] of Knowledge: Fair  Language: Fair   Psychomotor Activity  Psychomotor Activity: Psychomotor Activity: Normal   Assets  Assets: Manufacturing systems engineer; Desire for Improvement; Housing; Physical Health; Resilience; Social Support   Sleep  Sleep: Sleep: Poor Number of Hours of Sleep: 4   Nutritional Assessment (For OBS and FBC admissions only) Has the patient had a weight loss or gain of 10 pounds or more in the last 3 months?: No Has the patient had a decrease in food intake/or appetite?: No Does the patient have dental problems?: No Does the patient have eating habits or behaviors that may be indicators of an eating disorder including binging or inducing vomiting?: No Has the patient recently lost weight without trying?: 0 Has the patient been eating poorly because of a decreased appetite?: 0 Malnutrition Screening Tool Score: 0    Physical Exam Vitals and nursing note reviewed.  Constitutional:      Appearance: Normal appearance.  HENT:     Head: Normocephalic.     Nose: Nose normal.  Eyes:     Extraocular Movements: Extraocular movements intact.  Cardiovascular:     Rate and Rhythm: Normal rate.  Pulmonary:     Effort: Pulmonary effort is normal.  Musculoskeletal:        General: Normal range of motion.     Cervical back: Normal range of motion.  Neurological:     General: No  focal deficit present.     Mental Status: He is alert and oriented to person, place, and time.    Review of Systems  Constitutional: Negative.   HENT: Negative.    Eyes: Negative.   Respiratory: Negative.    Cardiovascular: Negative.   Gastrointestinal: Negative.   Genitourinary: Negative.   Musculoskeletal: Negative.   Neurological: Negative.   Endo/Heme/Allergies: Negative.   Psychiatric/Behavioral:  Positive for depression, substance abuse and suicidal ideas. The patient has insomnia.     Blood pressure 137/78, pulse 71, resp. rate 20, SpO2 99%. There is no height or weight on file to calculate BMI.  Past Psychiatric History: Mild IDD, ODD, ADHD, cocaine use and THC use.  Is the patient at risk to self? Yes  Has the patient been a risk to self in the past 6 months? No .    Has the patient been a risk to self within the distant past? Yes   Is the patient a risk to others? No   Has the patient been a risk to others in the past 6 months? No   Has the patient been a risk to others within the distant past? Yes   Past Medical History: No medical history reported  Family History: None reported   Social History: Pt currently lives with his mother and cousin. He reports that he also has  a wife with a baby on the way. He is currently unemployed. Endorses cocaine use.   Last Labs:  Admission on 04/07/2024  Component Date Value Ref Range Status   POC Amphetamine UR 04/07/2024 None Detected   Final   POC Secobarbital (BAR) 04/07/2024 None Detected   Final   POC Buprenorphine (BUP) 04/07/2024 None Detected   Final   POC Oxazepam (BZO) 04/07/2024 None Detected   Final   POC Cocaine UR 04/07/2024 None Detected   Final   POC Methamphetamine UR 04/07/2024 None Detected   Final   POC Morphine 04/07/2024 None Detected   Final   POC Methadone UR 04/07/2024 None Detected   Final   POC Oxycodone UR 04/07/2024 None Detected   Final   POC Marijuana UR 04/07/2024 None Detected   Final     Allergies: Penicillins  Medications:  Facility Ordered Medications  Medication   acetaminophen (TYLENOL) tablet 650 mg   alum & mag hydroxide-simeth (MAALOX/MYLANTA) 200-200-20 MG/5ML suspension 30 mL   magnesium hydroxide (MILK OF MAGNESIA) suspension 30 mL   haloperidol (HALDOL) tablet 5 mg   And   diphenhydrAMINE (BENADRYL) capsule 50 mg   haloperidol lactate (HALDOL) injection 5 mg   And   diphenhydrAMINE (BENADRYL) injection 50 mg   And   LORazepam (ATIVAN) injection 2 mg   haloperidol lactate (HALDOL) injection 10 mg   And   diphenhydrAMINE (BENADRYL) injection 50 mg   And   LORazepam (ATIVAN) injection 2 mg   hydrOXYzine (ATARAX) tablet 25 mg   traZODone (DESYREL) tablet 50 mg      Medical Decision Making  Based on this assessment and chart review, pt is recommended for inpatient treatment for mood stabilization and safety. Pt is endorsing SI with thoughts to overdose on cocaine to make his heart stop. He is currently voluntary and agrees to this treatment plan.  Treatment Plan: -Admit for continuous assessment until appropriate inpatient bed is found. -Labs ordered to rule out physiological causes.  Meds ordered this encounter  Medications   acetaminophen (TYLENOL) tablet 650 mg   alum & mag hydroxide-simeth (MAALOX/MYLANTA) 200-200-20 MG/5ML suspension 30 mL   magnesium hydroxide (MILK OF MAGNESIA) suspension 30 mL   AND Linked Order Group    haloperidol (HALDOL) tablet 5 mg    diphenhydrAMINE (BENADRYL) capsule 50 mg   AND Linked Order Group    haloperidol lactate (HALDOL) injection 5 mg    diphenhydrAMINE (BENADRYL) injection 50 mg    LORazepam (ATIVAN) injection 2 mg   AND Linked Order Group    haloperidol lactate (HALDOL) injection 10 mg    diphenhydrAMINE (BENADRYL) injection 50 mg    LORazepam (ATIVAN) injection 2 mg   hydrOXYzine (ATARAX) tablet 25 mg   traZODone (DESYREL) tablet 50 mg   Lab Orders         CBC with Differential/Platelet          Comprehensive metabolic panel         Hemoglobin A1c         Ethanol         Lipid panel         TSH         POCT Urine Drug Screen - (I-Screen)     EKG    Recommendations  Based on my evaluation the patient does not appear to have an emergency medical condition.  Davia Erps, NP 04/07/24  3:48 PM

## 2024-04-07 NOTE — ED Notes (Signed)
 Pt A7o x 45, sleeping at present, no distress noted.  Monitoring for safety.

## 2024-04-07 NOTE — Progress Notes (Signed)
 Patient has been denied by [facility] due to no appropriate beds available. Patient meets BH inpatient criteria per Nadara Auerbach, NP. Patient has been faxed out to the following facilities:   [facility] [address], [place] [address] [phone]  [facility] [address] [place] [address] [phone]  [facility] [address] [place], [place] [address] [phone]  [facility] [address] [place] [address] [phone]  [facility] [address], [place] [address] [phone]  [facility] [address] [place] [address] [phone]  [facility] [address] [place] [address] [phone]  [facility] [place] [phone]  [facility] EFAX [address] [place], [place] [phone]  [facility] [address] [place] [address] [phone]  [facility] [address], [place] [address] [phone]  [facility] [address] [place] [address] [phone]  [facility], [place] [phone]  [facility] [place] [address] [place], [place] [address] [phone]  [facility] Patient Placement [facility], [place] [phone]  [facility] [address] [place] [address] [phone]  [facility] [address] [place] [address] [phone]  [facility] Josephina Nicks [place] [address] [phone]  [phone]  [facility] [address] [place] [address] [phone]  [facility] [address] [place], [place] [address] [phone]  [facility] [address], [place] [address] [phone]   Phares Brasher, MSW, LCSW-A  10:30 PM 04/07/2024

## 2024-04-07 NOTE — ED Notes (Signed)
 Vol status.  Pt arrived to [facility] bed 3.  Pt reported he is requesting detox from cocaine with last use on 4/17 approximately an 8 ball at a time.  Pt reports only snorting cocaine. Denied current SI plan and intent.  Denied HI and A/V hallucinations.  Hourly observations in place for safety

## 2024-04-07 NOTE — ED Notes (Signed)
 Pt has been observed in milieu on the phone and talking with peers.  Calm and cooperative. Hourly rounds continue for safety

## 2024-04-07 NOTE — ED Notes (Signed)
 Pt was provided dinner.

## 2024-04-08 NOTE — Discharge Instructions (Signed)
 Discharge recommendations:   Medications: Patient is to take medications as prescribed. The patient or patient's guardian is to contact a medical professional and/or outpatient provider to address any new side effects that develop. The patient or the patient's guardian should update outpatient providers of any new medications and/or medication changes.    Outpatient Follow up: Please review list of outpatient resources for psychiatry and counseling. Please follow up with your primary care provider for all medical related needs.    Therapy: We recommend that patient participate in individual therapy to address mental health concerns.   Atypical antipsychotics: If you are prescribed an atypical antipsychotic, it is recommended that your height, weight, BMI, blood pressure, fasting lipid panel, and fasting blood sugar be monitored by your outpatient providers.  Safety:   The following safety precautions should be taken:   No sharp objects. This includes scissors, razors, scrapers, and putty knives.   Chemicals should be removed and locked up.   Medications should be removed and locked up.   Weapons should be removed and locked up. This includes firearms, knives and instruments that can be used to cause injury.   The patient should abstain from use of illicit substances/drugs and abuse of any medications.  If symptoms worsen or do not continue to improve or if the patient becomes actively suicidal or homicidal then it is recommended that the patient return to the closest hospital emergency department, the [facility], or call 911 for further evaluation and treatment. National Suicide Prevention Lifeline 1-800-SUICIDE or (503) 168-7511.  About 988 988 offers 24/7 access to trained crisis counselors who can help people experiencing mental health-related distress. People can call or text 988 or chat 988lifeline.org for themselves or if they are worried about a  loved one who may need crisis support.    You are encouraged to follow up with [facility] for outpatient treatment.  Walk in/ Open Access Hours: PLEASE ARRIVE BY 7AM for first walk-in service. Monday - Friday 8AM - 10AM   [facility] [address] [place], [place] [phone]  Substance Abuse Resources     SUBSTANCE USE TREATMENT for Medicaid and State Funded/IPRS  [facility] [address], [place], [address] [phone] phone NOTE: [facility] is no longer offering IOP services.  Serves those who are low-income or have no insurance.  [facility] [address] [place], [place], [address] [phone] phone [phone] fax NOTE: Does have Substance Abuse-Intensive Outpatient Program [facility]) as well as transitional housing if eligible.  [facility] [address] [place], [place], [address] [phone] phone [phone] fax  [facility] [phone] [place], [place], [address] [phone] phone [phone] fax    HALFWAY HOUSES:  [facility] [phone]  [employer].oxfordvacancies.com     12 STEP PROGRAMS:  Alcoholics Anonymous of [place] SoftwareChalet.be  Narcotics Anonymous of [place] HitProtect.dk  Al-Anon of [place], [place] www.[place]alanon.org/find-meetings.html  Nar-Anon https://nar-anon.org/find-a-meetin      List of Residential placements:   [facility] in [place]: [phone]  [facility]: [phone]  [place]ah [place], [place] [phone]: Male and male facility; 30-day program: (uninsured and Medicaid such as Cheryll Corti, [place], [place], partners)  [facility]: [phone]; men and women's facility; 28 days; Can have Medicaid tailored plan [employer] or [employer])  [facility]: [phone] Milda Aline  or Tanis Fan; 28 day program; must be fully detox; tailored Medicaid or no insurance  [address] [place] in [place], [place]; [phone]; 28 day all males program; no insurance accepted  [facility] Referral in [place]: Asa Lauth [phone] (no insurance or Medicaid only); 90 days; outpatient services but provide housing in apartments [place]  [facility] Admission: [phone]: Patient must complete phone screening for placement: [place], [place]; 6 month program; uninsured, Medicaid, and [employer].   [facility]: no insurance required; [phone]  [facility]: [phone]; Intake: Porfirio Bristol; Must fill out application online; Charlcie Conger Delay [phone] x [address] [facility] in [place], [place]: [phone]; Admissions Coordinators Mr. Cornel Diesel or Carolynne Citron; 90 day program.  [facility]: [place], [place] [phone]; Co-Ed 9 month to a year program; Online application; Men entry fee is $500 (6-78months);  [employer]: [address] [place], [place] [address]; no fee or insurance required; minimum of 2 years; Highly structured; work based; Intake Coordinator is Larinda Plover [phone]  [facility] in [place], [place]: [phone]; Fax number is [phone]; website: www.[facility].org; Requires 3-6 page autobiography; 2 year program (18 months and then 52month transitional housing); Admission fee is $300; no insurance needed; work Automotive engineer in [place], [place]: [employer] Desk Staff: Annalee Barren [phone]: They have a Men's Regenerations Program 6-2months. Free program; There is an initial $300 fee however, they are willing to work with patients regarding that. Application is online.  [facility]: Admissions [phone] Almeta Arm ext [phone]; Any 7-90 day program is out of pocket; 12 month program is free of charge; there is a $275 entry fee; Patient is responsible for own transportation

## 2024-04-08 NOTE — ED Provider Notes (Signed)
 FBC/OBS ASAP Discharge Summary  Date and Time: 04/08/2024 12:25 PM  Name: Willie Morris  MRN:  [id number]   Discharge Diagnoses:  Final diagnoses:  Cocaine use  Depression with suicidal ideation    Subjective: Willie Morris 28 y.o., male patient presented to [facility] as a voluntary walk in with complaints of suicidal ideations and cocaine use.  Patient has past psychiatric history of Mild IDD, ODD, ADHD, cocaine use and THC use.  Patient reports that he is currently being followed outpatient by [facility] but that he was arrested for possession of cocaine in April and has run out of his medications since then.  Patient reports that he is prescribed trazodone for sleep and is unable to remember the name of another medication that he takes for ADHD.Willie Morris, is seen face to face by this provider, consulted with Dr. Docia Freeman; and chart reviewed on 04/08/24.  On evaluation Willie Morris reports feeling much better and presents with a brighter affect.  Patient reports that he does not want to harm himself and that he only said that because he could no longer stay at the [facility].  Patient states that he would like resources for outpatient substance abuse as well as residential options.  Patient was provided with these options as well as phone numbers that he can call.  Patient states that he spoke with someone at the [facility] in [place] and that they told him that he may be able to go there on Monday.  He does not have any prior suicide attempts or self injures behaviors noted during chart review.  Patient does have mild IDD with very concrete reasoning.  He was previously recommended for inpatient treatment however has not been placed due to his IDD diagnosis, although he is high functioning.  Patient states that he does not think he needs inpatient treatment at this time.  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  Patient currently lives at home with his mother and a cousin and would like to discharge back home until he can go to the [facility] on Monday.  Patient states he also has to get some money for his stay at [facility]  I did attempt to call patient's mother to safety plan however there was no answer or option for voicemail.  During evaluation Willie Morris is awake talking on the phone, in no acute distress.  He is alert & oriented x 4, calm, cooperative and attentive for this assessment.  His mood is euthymic with congruent affect.  He has normal speech, and behavior.  Objectively there is no evidence of psychosis/mania or delusional thinking. Pt does not appear to be responding to internal or external stimuli.  Patient is able to converse coherently, displays concrete but goal directed thoughts, no pre-occupation.  He also denies current suicidal ideations, homicidal ideation, psychosis, and paranoia.  Patient answered question appropriately.    Stay Summary: 04/07/24   Willie Morris 28 y.o., male patient presented to [facility] as a voluntary walk in accompanied by [name] with complaints of suicidal ideations with thoughts to overdose on cocaine. Mild IDD, ODD, ADHD, cocaine use and THC use.  Patient reports that he was receiving outpatient psychiatric treatment at [facility] prior to being arrested for cocaine possession on March 16, 2024.  Patient has extensive history with in the [facility] healthcare system. Willie Morris, is seen face to face  by this provider, consulted with Dr. Docia Freeman; and chart reviewed on 04/07/24.  After extensive chart review, it is noted that patient has a history of aggressive behavior and agitation as well as substance abuse.  His last inpatient hospitalization was in 2015 in [place] (possibly at [facility]).  He also has inpatient hospitalizations at [facility] throughout his adolescence for aggression and defiance.  During  assessment patient that he is is unsure what medication he is prescribed and states that he has not taken it in about 1 month.  He states that he is going to call his mom and cousin to the they could read his medications to him.  Patient states that he is currently on pretrial release and was staying at a substance abuse residential program called [facility], however he was kicked out because they do not allow psychotropic medications in the facility.  Patient reports that he has been having worsening depressive symptoms and cocaine to mask those symptoms including sadness, hopelessness, worthlessness, anhedonia and suicidal ideations for the past couple weeks.  Patient states that he was planning on buying a bunch of cocaine today to overdose on.  Patient reports that he has a child on the way and that is why he came in to get help.  Patient reports that he also has a upcoming court date for his drug-related charge in June and that he would like to be better stabilized at that time.  Patient denies history of prior suicide attempts.  He does report having a history of aggressive behaviors as a teenager.  Patient reports poor sleep, difficulty staying asleep and falling asleep.  Patient reports having a good appetite.  No aggression or agitation noted during this assessment.  Patient states that he feels that he may need inpatient psychiatric treatment so that these thoughts do not get worse and so that he can eventually be back around his girlfriend and unborn child.  We discussed the process for admission to an inpatient psychiatric unit and patient agrees to this plan.During evaluation Willie Morris is sitting in assessment room, in no acute distress.  He is alert & oriented x 4, calm, cooperative and attentive for this assessment.  His mood is depressed with incongruent euthymic affect.  He has normal speech, and restless behavior.  Objectively there is no evidence of psychosis/mania or  delusional thinking. Pt does not appear to be responding to internal or external stimuli.  Patient is able to converse coherently, goal directed thoughts, no distractibility, or pre-occupation.  He currently denies homicidal ideation, psychosis, and paranoia.  Patient continues to endorse having suicidal ideations with a plan to overdose on cocaine.  Patient answered assessment questions appropriately.    Total Time spent with patient: 30 minutes  Past Psychiatric History: Mild IDD, ODD, ADHD, cocaine use and THC use.  Past Medical History: No medical history reported  Family History: None reported Family Psychiatric History: None reported Social History: Pt currently lives with his mother and cousin. He reports that he also has a wife with a baby on the way. He is currently unemployed. Endorses cocaine use.  Tobacco Cessation:  N/A, patient does not currently use tobacco products  Current Medications:  Current Facility-Administered Medications  Medication Dose Route Frequency Provider Last Rate Last Admin   acetaminophen (TYLENOL) tablet 650 mg  650 mg Oral Q6H PRN Willie Grant C, NP       alum & mag hydroxide-simeth (MAALOX/MYLANTA) 200-200-20 MG/5ML suspension 30 mL  30 mL Oral  Q4H PRN Willie Minshall C, NP       haloperidol (HALDOL) tablet 5 mg  5 mg Oral TID PRN Willie Frasco C, NP       And   diphenhydrAMINE (BENADRYL) capsule 50 mg  50 mg Oral TID PRN Willie Hanover C, NP       haloperidol lactate (HALDOL) injection 5 mg  5 mg Intramuscular TID PRN Willie Speedy C, NP       And   diphenhydrAMINE (BENADRYL) injection 50 mg  50 mg Intramuscular TID PRN Willie Tena C, NP       And   LORazepam (ATIVAN) injection 2 mg  2 mg Intramuscular TID PRN Odesser Tourangeau C, NP       haloperidol lactate (HALDOL) injection 10 mg  10 mg Intramuscular TID PRN Vaishnavi Dalby C, NP       And   diphenhydrAMINE (BENADRYL) injection 50 mg  50 mg Intramuscular TID PRN Willie Morris C, NP       And   LORazepam  (ATIVAN) injection 2 mg  2 mg Intramuscular TID PRN Stellarose Cerny C, NP       hydrOXYzine (ATARAX) tablet 25 mg  25 mg Oral TID PRN Bryam Taborda C, NP   25 mg at 04/07/24 2115   magnesium hydroxide (MILK OF MAGNESIA) suspension 30 mL  30 mL Oral Daily PRN Katielynn Horan C, NP       traZODone (DESYREL) tablet 50 mg  50 mg Oral QHS PRN Willie Morris C, NP   50 mg at 04/07/24 2114   No current outpatient medications on file.    PTA Medications:  Facility Ordered Medications  Medication   acetaminophen (TYLENOL) tablet 650 mg   alum & mag hydroxide-simeth (MAALOX/MYLANTA) 200-200-20 MG/5ML suspension 30 mL   magnesium hydroxide (MILK OF MAGNESIA) suspension 30 mL   haloperidol (HALDOL) tablet 5 mg   And   diphenhydrAMINE (BENADRYL) capsule 50 mg   haloperidol lactate (HALDOL) injection 5 mg   And   diphenhydrAMINE (BENADRYL) injection 50 mg   And   LORazepam (ATIVAN) injection 2 mg   haloperidol lactate (HALDOL) injection 10 mg   And   diphenhydrAMINE (BENADRYL) injection 50 mg   And   LORazepam (ATIVAN) injection 2 mg   hydrOXYzine (ATARAX) tablet 25 mg   traZODone (DESYREL) tablet 50 mg        No data to display          Flowsheet Row ED from 04/07/2024 in [facility]  Morris-SSRS RISK CATEGORY No Risk       Musculoskeletal  Strength & Muscle Tone: within normal limits Gait & Station: normal Patient leans: N/A  Psychiatric Specialty Exam  Presentation  General Appearance:  Appropriate for Environment  Eye Contact: Good  Speech: Clear and Coherent  Speech Volume: Normal  Handedness: Right   Mood and Affect  Mood: Euthymic  Affect: Appropriate   Thought Process  Thought Processes: Coherent; Linear  Descriptions of Associations:Intact  Orientation:Full (Time, Place and Person)  Thought Content:WDL  Diagnosis of Schizophrenia or Schizoaffective disorder in past: No    Hallucinations:Hallucinations: None  Ideas of  Reference:None  Suicidal Thoughts:Suicidal Thoughts: No SI Passive Intent and/or Plan: With Plan  Homicidal Thoughts:Homicidal Thoughts: No   Sensorium  Memory: Immediate Good; Recent Good  Judgment: Good  Insight: Good   Executive Functions  Concentration: Good  Attention Span: Good  Recall: Good  Fund of Knowledge: Good  Language: Good  Psychomotor Activity  Psychomotor Activity: Psychomotor Activity: Normal   Assets  Assets: Desire for Improvement; Housing; Physical Health; Social Support; Vocational/Educational   Sleep  Sleep: Sleep: Good Number of Hours of Sleep: 8   Nutritional Assessment (For OBS and FBC admissions only) Has the patient had a weight loss or gain of 10 pounds or more in the last 3 months?: No Has the patient had a decrease in food intake/or appetite?: No Does the patient have dental problems?: No Does the patient have eating habits or behaviors that may be indicators of an eating disorder including binging or inducing vomiting?: No Has the patient recently lost weight without trying?: 0 Has the patient been eating poorly because of a decreased appetite?: 0 Malnutrition Screening Tool Score: 0    Physical Exam  Physical Exam Vitals and nursing note reviewed.  Constitutional:      Appearance: Normal appearance.  HENT:     Head: Normocephalic.     Nose: Nose normal.  Eyes:     Extraocular Movements: Extraocular movements intact.  Cardiovascular:     Rate and Rhythm: Normal rate.  Pulmonary:     Effort: Pulmonary effort is normal.  Musculoskeletal:        General: Normal range of motion.     Cervical back: Normal range of motion.  Neurological:     General: No focal deficit present.     Mental Status: He is alert and oriented to person, place, and time.    Review of Systems  Constitutional: Negative.   HENT: Negative.    Eyes: Negative.   Respiratory: Negative.    Cardiovascular: Negative.    Gastrointestinal: Negative.   Genitourinary: Negative.   Musculoskeletal: Negative.   Neurological: Negative.   Endo/Heme/Allergies: Negative.   Psychiatric/Behavioral:  Positive for depression.    Blood pressure 120/70, pulse 86, temperature 97.9 F (36.6 Morris), temperature source Oral, resp. rate 16, SpO2 99%. There is no height or weight on file to calculate BMI.  Demographic Factors:  Male and Unemployed  Loss Factors: Financial problems/change in socioeconomic status  Historical Factors: Impulsivity  Risk Reduction Factors:   Responsible for children under 46 years of age, Sense of responsibility to family, Living with another person, especially a relative, and Positive social support  Continued Clinical Symptoms:  Previous Psychiatric Diagnoses and Treatments  Cognitive Features That Contribute To Risk:  Closed-mindedness    Suicide Risk:  Mild:  Suicidal ideation of limited frequency, intensity, duration, and specificity.  There are no identifiable plans, no associated intent, mild dysphoria and related symptoms, good self-control (both objective and subjective assessment), few other risk factors, and identifiable protective factors, including available and accessible social support.  Plan Of Care/Follow-up recommendations:  Based on the evaluation today, it seems as though patient reported SI in the context of not knowing how to transition from substance abuse housing program back to his home.  Patient denies having any suicidal ideations and does not have history of suicide attempts.  Patient denies feeling any depressive symptoms and states that he did reach out to [facility] who explained that patient may call back on Monday for possible bed availability.  Patient is discharged home with plan to make an appointment with provider at [facility] for medication management.  Patient is encouraged to abstain from substance use as this may worsen mood or conduct.  Patient agrees  and verbalizes understanding.  Patient exhibits future oriented thinking as he expresses excitement to start working again and to see his  significant other.  Safety planning completed as follows: Safety Plan Nikalas Spohr will reach out to his mother or best friend, call 911 or call mobile crisis, or go to nearest emergency room if condition worsens or if suicidal thoughts become active Patients' will follow up with [facility] for outpatient psychiatric services (therapy/medication management).  The suicide prevention education provided includes the following: Suicide risk factors Suicide prevention and interventions National Suicide Hotline telephone number [facility] assessment telephone number [facility] Emergency Assistance 911 [facility] and/or Residential Mobile Crisis Unit telephone number Request made of family/significant other to:   Remove weapons (e.g., guns, rifles, knives), all items previously/currently identified as safety concern.   Remove drugs/medications (over the counter, prescriptions, illicit drugs), all items previously/currently identified as a safety concern.   Disposition: Discharged home with plans to go to [facility] on Monday for ongoing substance abuse treatment.  Davia Erps, NP 04/08/2024, 12:25 PM

## 2024-08-08 ENCOUNTER — Encounter (HOSPITAL_COMMUNITY): Payer: Self-pay

## 2024-08-08 ENCOUNTER — Other Ambulatory Visit: Payer: Self-pay

## 2024-08-08 ENCOUNTER — Emergency Department (HOSPITAL_COMMUNITY)
Admission: EM | Admit: 2024-08-08 | Discharge: 2024-08-08 | Disposition: A | Discharge status: Home / Self Care | Payer: Self-pay

## 2024-08-08 DIAGNOSIS — H00014 Hordeolum externum left upper eyelid: Secondary | ICD-10-CM | POA: Insufficient documentation

## 2024-08-08 MED ORDER — ERYTHROMYCIN 5 MG/GM OP OINT: 1.0000 | TOPICAL_OINTMENT | Freq: Three times a day (TID) | OPHTHALMIC | 0 refills | Status: AC

## 2024-08-08 MED ORDER — ERYTHROMYCIN 5 MG/GM OP OINT: 1.0000 | TOPICAL_OINTMENT | Freq: Three times a day (TID) | OPHTHALMIC | 0 refills | Status: DC

## 2024-08-08 NOTE — Discharge Instructions (Addendum)
 Please apply a warm washcloth over the area for 15 minutes at a time 3-4 times daily.  Please use the eye ointment as prescribed.  If you notice any kind of worsening swelling or any kind of pain with eye movement then please come back to ED for further evaluation.  Otherwise, please keep the area clean.  Please wash your eyelids multiple times per day with soap and water.

## 2024-08-08 NOTE — ED Provider Notes (Signed)
 [place] EMERGENCY DEPARTMENT AT [facility] Provider Note   CSN: [id number] Arrival date & time: 08/08/24  9057     Patient presents with: Eye Problem   Willie Morris is a 28 y.o. male.    Eye Problem Associated symptoms: no vomiting    Presents due to bump upper outer eylid on the left eye. Has been present for the past two days. Was able to drain a little fluid out of the area. He has never had these bumps before. Has no vision changes/pain of the eye.     Prior to Admission medications   Medication Sig Start Date End Date Taking? Authorizing Provider  erythromycin  ophthalmic ointment Place 1 Application into the left eye 3 (three) times daily for 7 days. 08/08/24 08/15/24 Yes Simon Lavonia SAILOR, MD    Allergies: Penicillins    Review of Systems  Constitutional:  Negative for chills and fever.  HENT:  Negative for ear pain and sore throat.   Eyes:  Negative for pain and visual disturbance.  Respiratory:  Negative for cough and shortness of breath.   Cardiovascular:  Negative for chest pain and palpitations.  Gastrointestinal:  Negative for abdominal pain and vomiting.  Genitourinary:  Negative for dysuria and hematuria.  Musculoskeletal:  Negative for arthralgias and back pain.  Skin:  Negative for color change and rash.  Neurological:  Negative for seizures and syncope.  All other systems reviewed and are negative.   Updated Vital Signs BP (!) 144/85 (BP Location: Right Arm)   Pulse 89   Temp 98.3 F (36.8 C) (Oral)   Resp 16   Ht 5' 4.5 (1.638 m)   Wt 72.6 kg   SpO2 98%   BMI 27.04 kg/m   Physical Exam Vitals and nursing note reviewed.  Constitutional:      General: He is not in acute distress.    Appearance: He is well-developed.  HENT:     Head: Normocephalic and atraumatic.  Eyes:     Conjunctiva/sclera: Conjunctivae normal.   Cardiovascular:     Rate and Rhythm: Normal rate and regular rhythm.     Heart sounds: No murmur  heard. Pulmonary:     Effort: Pulmonary effort is normal. No respiratory distress.     Breath sounds: Normal breath sounds.  Abdominal:     Palpations: Abdomen is soft.     Tenderness: There is no abdominal tenderness.  Musculoskeletal:        General: No swelling.     Cervical back: Neck supple.  Skin:    General: Skin is warm and dry.     Capillary Refill: Capillary refill takes less than 2 seconds.  Neurological:     Mental Status: He is alert.  Psychiatric:        Mood and Affect: Mood normal.     (all labs ordered are listed, but only abnormal results are displayed) Labs Reviewed - No data to display  EKG: None  Radiology: No results found.   Procedures   Medications Ordered in the ED - No data to display                                  Medical Decision Making  Presents due to bump upper outer eylid on the left eye. Has been present for the past two days. Was able to drain a little fluid out of the area. He has never had these  bumps before. Has no vision changes/pain of the eye.   Patient helps mostly look infected hordeolum.  Outside of the eye.  Was able to express drainage from the area with pressure.  Was able to drain what I think was the whole infected area.  No surrounding erythema or cellulitic changes.  No concerns for periorbital or orbital cellulitis.  No pain with eye movement.  No significant swelling.  Recommended cleaning the area 3-4 times daily.  Warm washcloths 2-4 times daily.  Did prescribe patient Romycin eye ointment as well.  Patient will follow-up with ophthalmology.     Final diagnoses:  Hordeolum externum of left upper eyelid    ED Discharge Orders          Ordered    erythromycin  ophthalmic ointment  3 times daily        08/08/24 1157               Simon Lavonia SAILOR, MD 08/08/24 1159

## 2024-08-08 NOTE — ED Triage Notes (Signed)
 Pt has a bump beside his left eye lid x 1 week. Pt reports trying to pop it and seeing white stuff come out. Pt reports that it does not interfere with his vision.
